# Patient Record
Sex: Male | Born: 1955 | Race: White | Hispanic: No | Marital: Married | Smoking: Current every day smoker
Health system: Southern US, Community
[De-identification: ages and names within clinical notes are randomized; demographics above are authoritative.]

## PROBLEM LIST (undated history)

## (undated) DIAGNOSIS — M7591 Shoulder lesion, unspecified, right shoulder: Secondary | ICD-10-CM

## (undated) DIAGNOSIS — I10 Essential (primary) hypertension: Secondary | ICD-10-CM

## (undated) DIAGNOSIS — R251 Tremor, unspecified: Secondary | ICD-10-CM

## (undated) DIAGNOSIS — E7800 Pure hypercholesterolemia, unspecified: Secondary | ICD-10-CM

## (undated) DIAGNOSIS — F32A Depression, unspecified: Secondary | ICD-10-CM

## (undated) DIAGNOSIS — Z789 Other specified health status: Secondary | ICD-10-CM

## (undated) DIAGNOSIS — K635 Polyp of colon: Secondary | ICD-10-CM

## (undated) DIAGNOSIS — F329 Major depressive disorder, single episode, unspecified: Secondary | ICD-10-CM

## (undated) HISTORY — PX: VASECTOMY: SHX75

## (undated) HISTORY — PX: FOOT SURGERY: SHX648

## (undated) HISTORY — PX: APPENDECTOMY: SHX54

## (undated) HISTORY — PX: HAND SURGERY: SHX662

## (undated) HISTORY — PX: HERNIA REPAIR: SHX51

## (undated) HISTORY — PX: TONSILLECTOMY: SUR1361

## (undated) HISTORY — PX: COLONOSCOPY: SHX174

---

## 2012-08-18 ENCOUNTER — Ambulatory Visit: Payer: Self-pay

## 2012-08-18 LAB — DOT URINE DIP
Blood: NEGATIVE
Glucose,UR: NEGATIVE mg/dL (ref 0–75)
Protein: NEGATIVE
Specific Gravity: 1.02 (ref 1.003–1.030)

## 2014-12-11 ENCOUNTER — Emergency Department (HOSPITAL_COMMUNITY): Payer: Worker's Compensation

## 2014-12-11 ENCOUNTER — Encounter (HOSPITAL_COMMUNITY): Payer: Self-pay

## 2014-12-11 ENCOUNTER — Emergency Department (HOSPITAL_COMMUNITY)
Admission: EM | Admit: 2014-12-11 | Discharge: 2014-12-11 | Disposition: A | Discharge status: Home / Self Care | Payer: Worker's Compensation | Attending: Emergency Medicine | Admitting: Emergency Medicine

## 2014-12-11 DIAGNOSIS — Z8639 Personal history of other endocrine, nutritional and metabolic disease: Secondary | ICD-10-CM | POA: Insufficient documentation

## 2014-12-11 DIAGNOSIS — W19XXXA Unspecified fall, initial encounter: Secondary | ICD-10-CM

## 2014-12-11 DIAGNOSIS — Y9289 Other specified places as the place of occurrence of the external cause: Secondary | ICD-10-CM | POA: Insufficient documentation

## 2014-12-11 DIAGNOSIS — S2231XA Fracture of one rib, right side, initial encounter for closed fracture: Secondary | ICD-10-CM | POA: Insufficient documentation

## 2014-12-11 DIAGNOSIS — Y990 Civilian activity done for income or pay: Secondary | ICD-10-CM | POA: Diagnosis not present

## 2014-12-11 DIAGNOSIS — Z8659 Personal history of other mental and behavioral disorders: Secondary | ICD-10-CM | POA: Diagnosis not present

## 2014-12-11 DIAGNOSIS — W108XXA Fall (on) (from) other stairs and steps, initial encounter: Secondary | ICD-10-CM | POA: Insufficient documentation

## 2014-12-11 DIAGNOSIS — Z720 Tobacco use: Secondary | ICD-10-CM | POA: Diagnosis not present

## 2014-12-11 DIAGNOSIS — S299XXA Unspecified injury of thorax, initial encounter: Secondary | ICD-10-CM | POA: Diagnosis present

## 2014-12-11 DIAGNOSIS — S3991XA Unspecified injury of abdomen, initial encounter: Secondary | ICD-10-CM | POA: Insufficient documentation

## 2014-12-11 DIAGNOSIS — Y9389 Activity, other specified: Secondary | ICD-10-CM | POA: Insufficient documentation

## 2014-12-11 HISTORY — DX: Major depressive disorder, single episode, unspecified: F32.9

## 2014-12-11 HISTORY — DX: Depression, unspecified: F32.A

## 2014-12-11 HISTORY — DX: Pure hypercholesterolemia, unspecified: E78.00

## 2014-12-11 LAB — URINALYSIS, ROUTINE W REFLEX MICROSCOPIC
GLUCOSE, UA: NEGATIVE mg/dL
Hgb urine dipstick: NEGATIVE
KETONES UR: NEGATIVE mg/dL
Leukocytes, UA: NEGATIVE
Nitrite: NEGATIVE
Protein, ur: NEGATIVE mg/dL
Specific Gravity, Urine: 1.029 (ref 1.005–1.030)
Urobilinogen, UA: 0.2 mg/dL (ref 0.0–1.0)
pH: 5 (ref 5.0–8.0)

## 2014-12-11 MED ORDER — IBUPROFEN 800 MG PO TABS: 800.0000 mg | ORAL_TABLET | Freq: Three times a day (TID) | ORAL | Status: DC | PRN

## 2014-12-11 MED ORDER — OXYCODONE-ACETAMINOPHEN 5-325 MG PO TABS: 1.0000 | ORAL_TABLET | ORAL | Status: DC | PRN

## 2014-12-11 MED ORDER — OXYCODONE-ACETAMINOPHEN 5-325 MG PO TABS
2.0000 | ORAL_TABLET | Freq: Once | ORAL | Status: AC
  Administered 2014-12-11: 2 via ORAL
  Filled 2014-12-11: qty 2

## 2014-12-11 NOTE — ED Notes (Signed)
Pt here for fall 12 hours ago at work and landed on right flank, pt reports pain to same area, seen at [facility] and sent here to rule out possible issue with diaphragm and has fractured ribs.

## 2014-12-11 NOTE — Discharge Instructions (Signed)

## 2014-12-11 NOTE — ED Notes (Signed)
Dr.ward to come speak with before d/c

## 2014-12-11 NOTE — ED Provider Notes (Signed)
TIME SEEN: 11:10 AM  CHIEF COMPLAINT: Right lateral chest pain and flank pain  HPI: Pt is a 59 y.o. with history of hyperlipidemia, depression who presents emergency department with a mechanical fall when he slipped on ice last night at 10:30 PM and fell down 5 stairs. Denies hitting his head or losing consciousness. Not on anticoagulation. Was seen at [facility] today and had an acute abdominal series and they were concerned for diaphragmatic rupture on the right side and sent him to the ED. Denies shortness of breath. Denies abdominal pain.  No nausea, vomiting or diarrhea. No bloody stool or melena. No hematuria. No numbness, tingling or focal weakness. States he just feels "sore all over". States he was able to work last night.  ROS: See HPI Constitutional: no fever  Eyes: no drainage  ENT: no runny nose   Cardiovascular:  no chest pain  Resp: no SOB  GI: no vomiting GU: no dysuria Integumentary: no rash  Allergy: no hives  Musculoskeletal: no leg swelling  Neurological: no slurred speech ROS otherwise negative  PAST MEDICAL HISTORY/PAST SURGICAL HISTORY:  Past Medical History  Diagnosis Date  . High cholesterol   . Depression     MEDICATIONS:  Prior to Admission medications   Not on File    ALLERGIES:  No Known Allergies  SOCIAL HISTORY:  History  Substance Use Topics  . Smoking status: Current Every Day Smoker  . Smokeless tobacco: Not on file  . Alcohol Use: Yes    FAMILY HISTORY: History reviewed. No pertinent family history.  EXAM: BP 149/90 mmHg  Pulse 80  Temp(Src) 98.4 F (36.9 C) (Oral)  Resp 16  Ht  (1.778 m)  Wt 180 lb (81.647 kg)  BMI 25.83 kg/m2  SpO2 97% CONSTITUTIONAL: Alert and oriented and responds appropriately to questions. Well-appearing; well-nourished; GCS 15 HEAD: Normocephalic; atraumatic EYES: Conjunctivae clear, PERRL, EOMI ENT: normal nose; no rhinorrhea; moist mucous membranes; pharynx without lesions noted; no dental  injury;  no septal hematoma NECK: Supple, no meningismus, no LAD; no midline spinal tenderness, step-off or deformity CARD: RRR; S1 and S2 appreciated; no murmurs, no clicks, no rubs, no gallops RESP: Normal chest excursion without splinting or tachypnea; breath sounds clear and equal bilaterally; no wheezes, no rhonchi, no rales; chest wall stable, mild stenosis palpation over the right flank and right lateral ribs without crepitus or ecchymosis or deformity ABD/GI: Normal bowel sounds; non-distended; soft, non-tender, no rebound, no guarding PELVIS:  stable, nontender to palpation BACK:  The back appears normal and is non-tender to palpation, there is no CVA tenderness; no midline spinal tenderness, step-off or deformity EXT: Normal ROM in all joints; non-tender to palpation; no edema; normal capillary refill; no cyanosis    SKIN: Normal color for age and race; warm NEURO: Moves all extremities equally, sensation to light touch intact diffusely, cranial nerves II through XII intact PSYCH: The patient's mood and manner are appropriate. Grooming and personal hygiene are appropriate.  MEDICAL DECISION MAKING: Patient here with mechanical fall with right flank pain. Concern for possible diaphragmatic rupture at [facility] on right side - very unlikely given this is right-sided and has no shortness of breath or right upper quadrant abdominal pain on exam. I have reviewed the x-ray pronounced that were sent with the patient and I do not see an obvious diaphragmatic rupture. Will repeat upright abdominal series, left rib series. We'll give Percocet. Will check urinalysis for gross hematuria. No other injury on exam. Neurologically intact,  hemodynamically stable.  ED PROGRESS: Patient appears to have a 10th and 11th right rib fracture with no pneumothorax. Specifically there is no diaphragmatic rupture. Patient is still hemodynamically stable. We'll discharge home with perception for pain medicine and an  incentive spirometer. Discussed return precautions. Patient verbalizes understanding and is comfortable with plan.     Layla MawKristen N Ward, DO 12/11/14 1258

## 2017-01-07 ENCOUNTER — Other Ambulatory Visit: Payer: Self-pay | Admitting: Otolaryngology

## 2017-01-07 DIAGNOSIS — R070 Pain in throat: Secondary | ICD-10-CM

## 2017-01-18 ENCOUNTER — Ambulatory Visit: Payer: Managed Care, Other (non HMO)

## 2017-01-22 ENCOUNTER — Ambulatory Visit
Admission: RE | Admit: 2017-01-22 | Discharge: 2017-01-22 | Disposition: A | Discharge status: Home / Self Care | Payer: Managed Care, Other (non HMO) | Source: Ambulatory Visit | Attending: Otolaryngology | Admitting: Otolaryngology

## 2017-01-22 DIAGNOSIS — R070 Pain in throat: Secondary | ICD-10-CM | POA: Insufficient documentation

## 2017-01-22 DIAGNOSIS — M4306 Spondylolysis, lumbar region: Secondary | ICD-10-CM | POA: Insufficient documentation

## 2017-01-22 DIAGNOSIS — J320 Chronic maxillary sinusitis: Secondary | ICD-10-CM | POA: Insufficient documentation

## 2017-01-22 MED ORDER — IOPAMIDOL (ISOVUE-300) INJECTION 61%
75.0000 mL | Freq: Once | INTRAVENOUS | Status: AC | PRN
  Administered 2017-01-22: 75 mL via INTRAVENOUS

## 2017-09-29 IMAGING — CT CT NECK W/ CM
2 of 3 series · 8 of 14 positions shown, 9 images · IV contrast (iopamidol)
Comparison: None.

CLINICAL DATA: Initial evaluation for mid throat discomfort with
swallowing since [DATE]. No history of cancer or surgery. Current
smoker.

EXAM:
CT NECK WITH CONTRAST
TECHNIQUE: Multidetector CT imaging of the neck was performed using the
standard protocol following the bolus administration of intravenous
contrast.
CONTRAST:  75mL [id number]-877 IOPAMIDOL (WUUWJG-877) INJECTION 61%

[Series 2: axial neck · axial · 0.60mm/px · z∈[-245,-53]mm · 4 of 160 slices shown]
[im 32/160  bone]
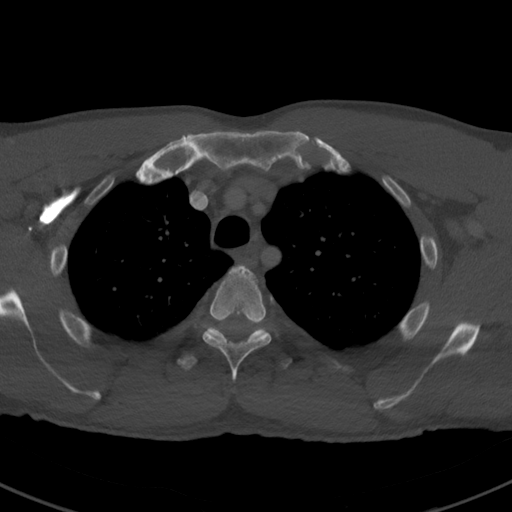
[im 64/160  bone]
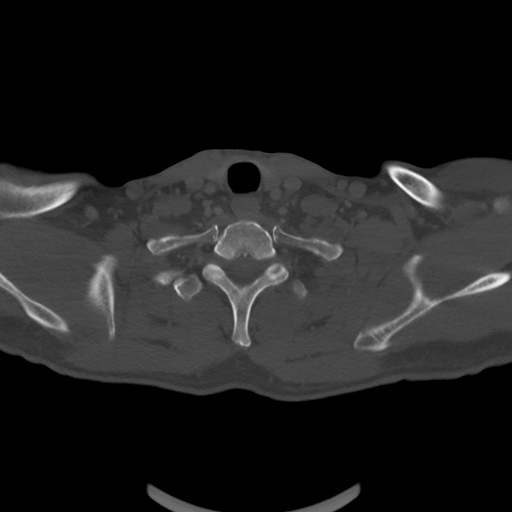
[im 96/160  bone]
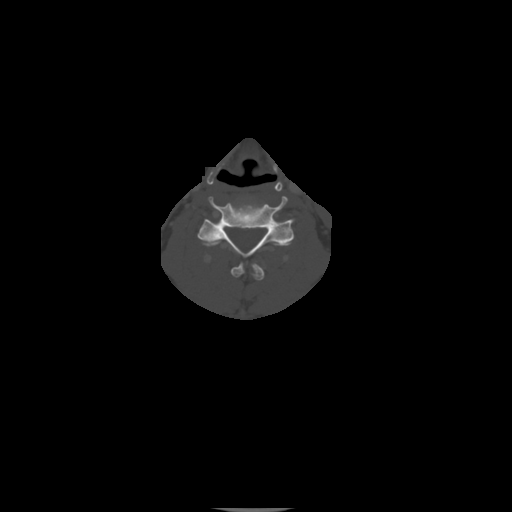
[im 128/160  bone]
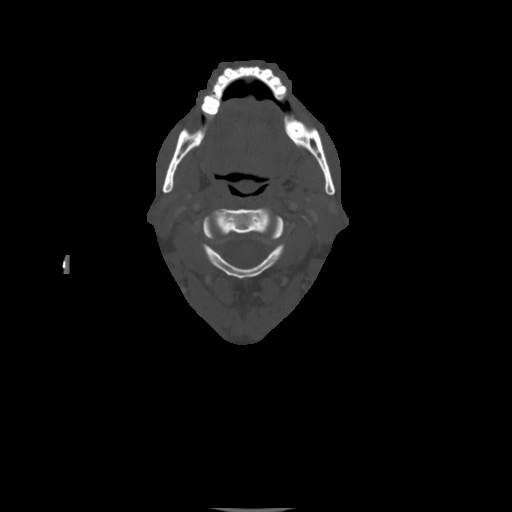

[Series 7: orthogonal ax · axial · 0.48mm/px · z∈[-264,-58]mm · 4 of 174 slices shown, 5 images]
[im 35/174  soft-tissue]
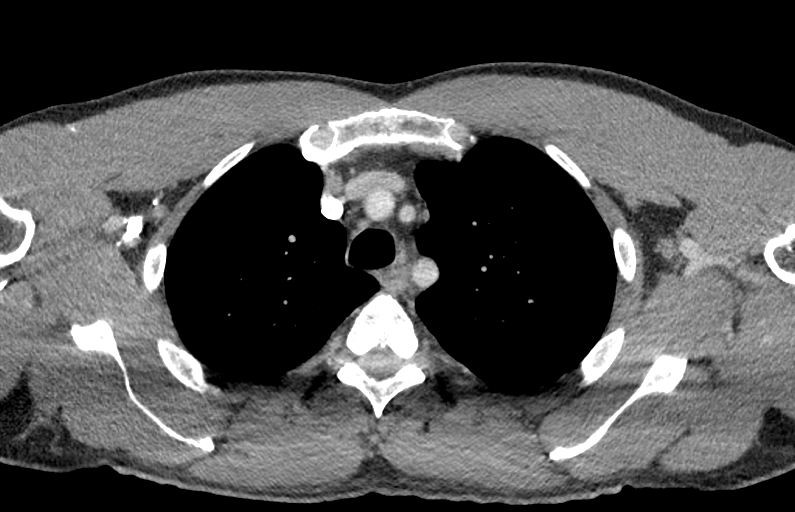
[im 35/174  bone]
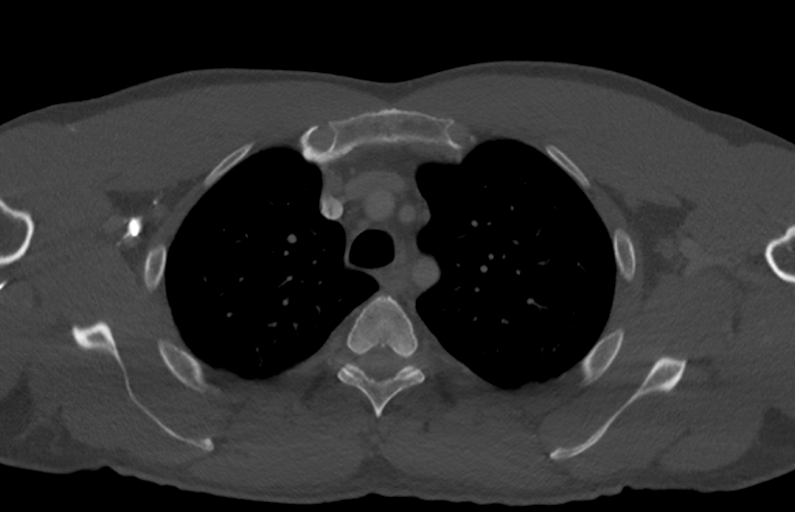
[im 70/174  bone]
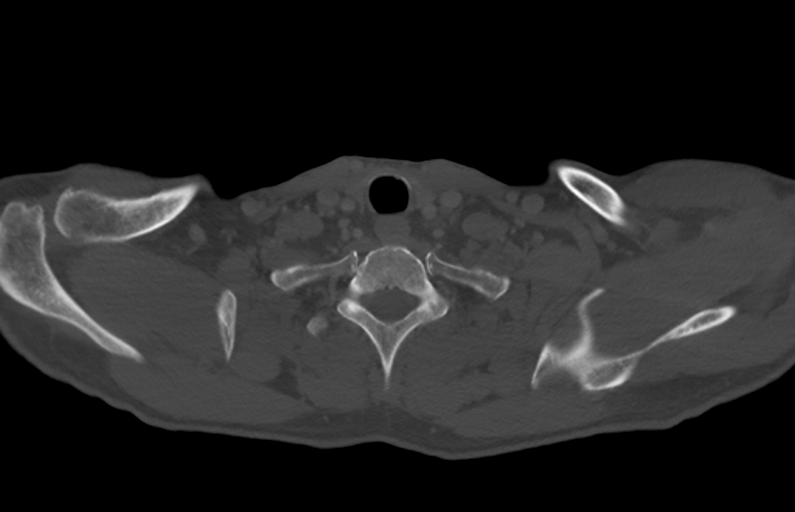
[im 104/174  bone]
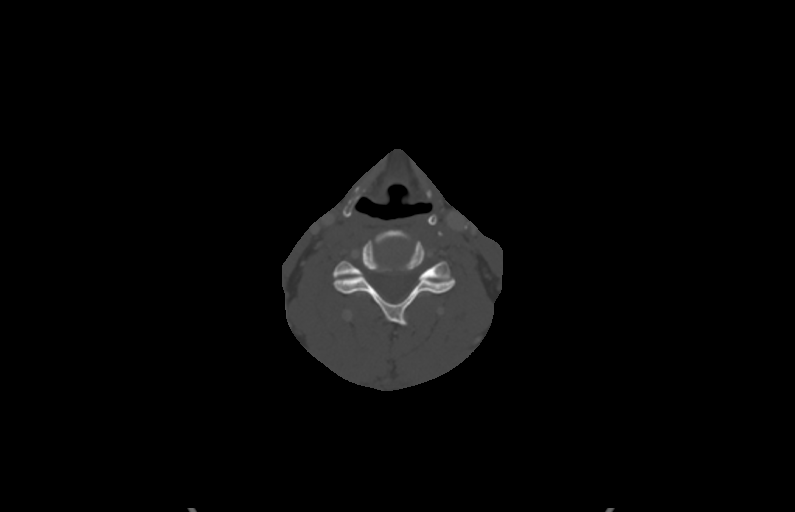
[im 139/174  bone]
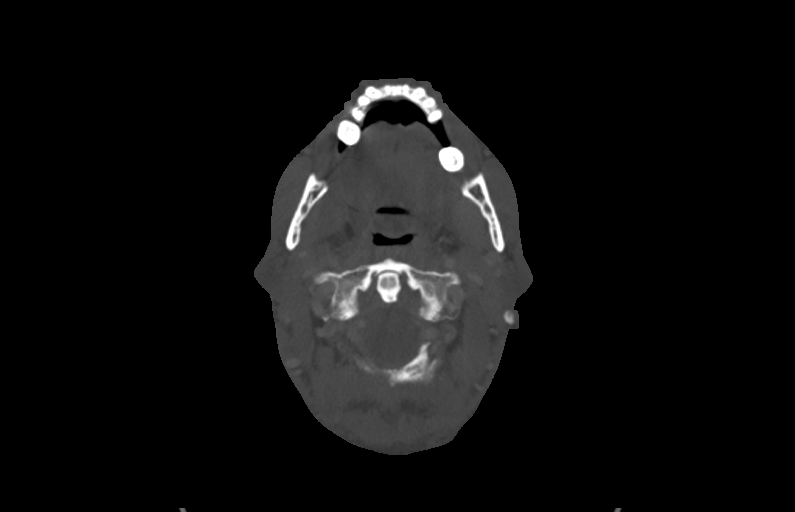

[8 of 14 positions shown; findings below may reference images not displayed]

FINDINGS: Pharynx and larynx: Oral cavity within normal limits without mass
lesion or loculated fluid collection. No acute abnormality about the
dentition. Periapical lucency noted about the right mandibular
second bicuspid. Palatine tonsils symmetric and within normal
limits. Parapharyngeal 5 preserved. Nasopharynx within normal
limits. Remainder of the oropharynx is within normal limits. The
epiglottis normal. Vallecula clear. Retropharyngeal soft tissues
within normal limits. Remainder of the hypopharynx and supraglottic
larynx within normal limits. True cords are largely apposed and not
well evaluated, but grossly within normal limits. Subglottic airway
is clear.

Salivary glands: Salivary glands including the parotid and
submandibular glands are normal.

Thyroid: Thyroid within normal limits.

Lymph nodes: No pathologically enlarged lymph nodes identified
within the neck.

Vascular: Normal intravascular enhancement seen throughout the neck.
Scattered calcified plaque noted about the carotid bifurcations
bilaterally.

Limited intracranial: Unremarkable.

Visualized orbits: Visualized portions of the inferior orbits are
unremarkable.

Mastoids and visualized paranasal sinuses: Moderate polypoid mucosal
thickening noted within the maxillary sinuses bilaterally.
Visualized paranasal sinuses are otherwise clear. No mastoid
effusion. Middle ear cavities are clear.

Skeleton: No acute osseus abnormality. No worrisome lytic or blastic
osseous lesions. Mild multilevel degenerative spondylolysis noted
within the visualized spine, most notable at C6-7. Additional
degenerative disc bulging noted at C3-4, C4-5 common C5-6.

Upper chest: Visualized upper mediastinum within normal limits.
Visualized lungs are clear.

Other: None.
IMPRESSION: 1. Normal CT of the neck. No findings to explain patient's symptoms
identified. No mass lesion or adenopathy.
2. Moderate allergic/inflammatory maxillary sinus disease.
3. Atherosclerotic changes about the carotid bifurcations
bilaterally.
4. Mild multilevel degenerative spondylolysis within the cervical
spine, most notable at C6-7.

## 2023-11-19 ENCOUNTER — Other Ambulatory Visit: Payer: Self-pay | Admitting: Orthopedic Surgery

## 2023-11-19 DIAGNOSIS — S46001A Unspecified injury of muscle(s) and tendon(s) of the rotator cuff of right shoulder, initial encounter: Secondary | ICD-10-CM

## 2023-11-19 DIAGNOSIS — M25311 Other instability, right shoulder: Secondary | ICD-10-CM

## 2023-11-19 DIAGNOSIS — M25511 Pain in right shoulder: Secondary | ICD-10-CM

## 2023-12-01 ENCOUNTER — Encounter: Payer: Self-pay | Admitting: Orthopedic Surgery

## 2023-12-04 ENCOUNTER — Ambulatory Visit
Admission: RE | Admit: 2023-12-04 | Discharge: 2023-12-04 | Disposition: A | Discharge status: Home / Self Care | Payer: Managed Care, Other (non HMO) | Source: Ambulatory Visit | Attending: Orthopedic Surgery | Admitting: Orthopedic Surgery

## 2023-12-04 DIAGNOSIS — S46001A Unspecified injury of muscle(s) and tendon(s) of the rotator cuff of right shoulder, initial encounter: Secondary | ICD-10-CM

## 2023-12-04 DIAGNOSIS — M25511 Pain in right shoulder: Secondary | ICD-10-CM

## 2023-12-04 DIAGNOSIS — M25311 Other instability, right shoulder: Secondary | ICD-10-CM

## 2023-12-30 ENCOUNTER — Other Ambulatory Visit: Payer: Self-pay | Admitting: Orthopedic Surgery

## 2024-01-07 ENCOUNTER — Encounter
Admission: RE | Admit: 2024-01-07 | Discharge: 2024-01-07 | Disposition: A | Discharge status: Home / Self Care | Source: Ambulatory Visit | Attending: Orthopedic Surgery | Admitting: Orthopedic Surgery

## 2024-01-07 VITALS — Ht 70.0 in | Wt 200.0 lb

## 2024-01-07 DIAGNOSIS — Z01812 Encounter for preprocedural laboratory examination: Secondary | ICD-10-CM

## 2024-01-07 DIAGNOSIS — Z01810 Encounter for preprocedural cardiovascular examination: Secondary | ICD-10-CM

## 2024-01-07 HISTORY — DX: Essential (primary) hypertension: I10

## 2024-01-07 HISTORY — DX: Shoulder lesion, unspecified, right shoulder: M75.91

## 2024-01-07 HISTORY — DX: Tremor, unspecified: R25.1

## 2024-01-07 HISTORY — DX: Other specified health status: Z78.9

## 2024-01-07 HISTORY — DX: Polyp of colon: K63.5

## 2024-01-07 NOTE — Patient Instructions (Signed)
 Your procedure is scheduled on:01-14-24 Friday Report to the Registration Desk on the 1st floor of the [facility].Then proceed to the 2nd floor Surgery Desk To find out your arrival time, please call [phone] between 1PM - 3PM on:01-13-24 Thursday If your arrival time is 6:00 am, do not arrive before that time as the [facility] entrance doors do not open until 6:00 am.  REMEMBER: Instructions that are not followed completely may result in serious medical risk, up to and including death; or upon the discretion of your surgeon and anesthesiologist your surgery may need to be rescheduled.  Do not eat food after midnight the night before surgery.  No gum chewing or hard candies.  You may however, drink CLEAR liquids up to 2 hours before you are scheduled to arrive for your surgery. Do not drink anything within 2 hours of your scheduled arrival time.  Clear liquids include: - water  - apple juice without pulp - gatorade (not RED colors) - black coffee or tea (Do NOT add milk or creamers to the coffee or tea) Do NOT drink anything that is not on this list.  In addition, your doctor has ordered for you to drink the provided:  Ensure Pre-Surgery Clear Carbohydrate Drink  Drinking this carbohydrate drink up to two hours before surgery helps to reduce insulin resistance and improve patient outcomes. Please complete drinking 2 hours before scheduled arrival time.  One week prior to surgery:Stop NOW (01-07-24) Stop Anti-inflammatories (NSAIDS) such as Advil, Aleve, Ibuprofen, Motrin, Naproxen, Naprosyn and Aspirin based products such as Excedrin, Goody's Powder, BC Powder. Stop ANY OVER THE COUNTER supplements until after surgery.  You may however, continue to take Tylenol if needed for pain up until the day of surgery.  Continue taking all of your other prescription medications up until the day of surgery.  ON THE DAY OF SURGERY ONLY TAKE THESE MEDICATIONS WITH SIPS OF  WATER: -atorvastatin (LIPITOR)  -primidone (MYSOLINE)  -venlafaxine XR (EFFEXOR-XR)   No Alcohol for 24 hours before or after surgery.  No Smoking including e-cigarettes for 24 hours before surgery.  No chewable tobacco products for at least 6 hours before surgery.  No nicotine patches on the day of surgery.  Do not use any "recreational" drugs for at least a week (preferably 2 weeks) before your surgery.  Please be advised that the combination of cocaine and anesthesia may have negative outcomes, up to and including death. If you test positive for cocaine, your surgery will be cancelled.  On the morning of surgery brush your teeth with toothpaste and water, you may rinse your mouth with mouthwash if you wish. Do not swallow any toothpaste or mouthwash.  Use CHG Soap as directed on instruction sheet.  Do not wear jewelry, make-up, hairpins, clips or nail polish.  For welded (permanent) jewelry: bracelets, anklets, waist bands, etc.  Please have this removed prior to surgery.  If it is not removed, there is a chance that hospital personnel will need to cut it off on the day of surgery.  Do not wear lotions, powders, or perfumes.   Do not shave body hair from the neck down 48 hours before surgery.  Contact lenses, hearing aids and dentures may not be worn into surgery.  Do not bring valuables to the hospital. [facility] is not responsible for any missing/lost belongings or valuables.   Notify your doctor if there is any change in your medical condition (cold, fever, infection).  Wear comfortable clothing (specific to  your surgery type) to the hospital.  After surgery, you can help prevent lung complications by doing breathing exercises.  Take deep breaths and cough every 1-2 hours. Your doctor may order a device called an Incentive Spirometer to help you take deep breaths. When coughing or sneezing, hold a pillow firmly against your incision with both hands. This is called  "splinting." Doing this helps protect your incision. It also decreases belly discomfort.  If you are being admitted to the hospital overnight, leave your suitcase in the car. After surgery it may be brought to your room.  In case of increased patient census, it may be necessary for you, the patient, to continue your postoperative care in the Same Day Surgery department.  If you are being discharged the day of surgery, you will not be allowed to drive home. You will need a responsible individual to drive you home and stay with you for 24 hours after surgery.   If you are taking public transportation, you will need to have a responsible individual with you.  Please call the Pre-admissions Testing Dept. at [phone] if you have any questions about these instructions.  Surgery Visitation Policy:  Patients having surgery or a procedure may have two visitors.  Children under the age of 28 must have an adult with them who is not the patient.  Temporary Visitor Restrictions Due to increasing cases of flu, RSV and COVID-19: Children ages 64 and under will not be able to visit patients in [facility] hospitals under most circumstances.     Preparing for Surgery with CHLORHEXIDINE GLUCONATE (CHG) Soap  Chlorhexidine Gluconate (CHG) Soap  o An antiseptic cleaner that kills germs and bonds with the skin to continue killing germs even after washing  o Used for showering the night before surgery and morning of surgery  Before surgery, you can play an important role by reducing the number of germs on your skin.  CHG (Chlorhexidine gluconate) soap is an antiseptic cleanser which kills germs and bonds with the skin to continue killing germs even after washing.  Please do not use if you have an allergy to CHG or antibacterial soaps. If your skin becomes reddened/irritated stop using the CHG.  1. Shower the NIGHT BEFORE SURGERY and the MORNING OF SURGERY with CHG soap.  2. If you choose to  wash your hair, wash your hair first as usual with your normal shampoo.  3. After shampooing, rinse your hair and body thoroughly to remove the shampoo.  4. Use CHG as you would any other liquid soap. You can apply CHG directly to the skin and wash gently with a scrungie or a clean washcloth.  5. Apply the CHG soap to your body only from the neck down. Do not use on open wounds or open sores. Avoid contact with your eyes, ears, mouth, and genitals (private parts). Wash face and genitals (private parts) with your normal soap.  6. Wash thoroughly, paying special attention to the area where your surgery will be performed.  7. Thoroughly rinse your body with warm water.  8. Do not shower/wash with your normal soap after using and rinsing off the CHG soap.  9. Pat yourself dry with a clean towel.  10. Wear clean pajamas to bed the night before surgery.  12. Place clean sheets on your bed the night of your first shower and do not sleep with pets.  13. Shower again with the CHG soap on the day of surgery prior to arriving at  the hospital.  14. Do not apply any deodorants/lotions/powders.  15. Please wear clean clothes to the hospital.  How to Use an Incentive Spirometer An incentive spirometer is a tool that measures how well you are filling your lungs with each breath. Learning to take long, deep breaths using this tool can help you keep your lungs clear and active. This may help to reverse or lessen your chance of developing breathing (pulmonary) problems, especially infection. You may be asked to use a spirometer: After a surgery. If you have a lung problem or a history of smoking. After a long period of time when you have been unable to move or be active. If the spirometer includes an indicator to show the highest number that you have reached, your health care provider or respiratory therapist will help you set a goal. Keep a log of your progress as told by your health care  provider. What are the risks? Breathing too quickly may cause dizziness or cause you to pass out. Take your time so you do not get dizzy or light-headed. If you are in pain, you may need to take pain medicine before doing incentive spirometry. It is harder to take a deep breath if you are having pain. How to use your incentive spirometer  Sit up on the edge of your bed or on a chair. Hold the incentive spirometer so that it is in an upright position. Before you use the spirometer, breathe out normally. Place the mouthpiece in your mouth. Make sure your lips are closed tightly around it. Breathe in slowly and as deeply as you can through your mouth, causing the piston or the ball to rise toward the top of the chamber. Hold your breath for 3-5 seconds, or for as long as possible. If the spirometer includes a coach indicator, use this to guide you in breathing. Slow down your breathing if the indicator goes above the marked areas. Remove the mouthpiece from your mouth and breathe out normally. The piston or ball will return to the bottom of the chamber. Rest for a few seconds, then repeat the steps 10 or more times. Take your time and take a few normal breaths between deep breaths so that you do not get dizzy or light-headed. Do this every 1-2 hours when you are awake. If the spirometer includes a goal marker to show the highest number you have reached (best effort), use this as a goal to work toward during each repetition. After each set of 10 deep breaths, cough a few times. This will help to make sure that your lungs are clear. If you have an incision on your chest or abdomen from surgery, place a pillow or a rolled-up towel firmly against the incision when you cough. This can help to reduce pain while taking deep breaths and coughing. General tips When you are able to get out of bed: Walk around often. Continue to take deep breaths and cough in order to clear your lungs. Keep using the  incentive spirometer until your health care provider says it is okay to stop using it. If you have been in the hospital, you may be told to keep using the spirometer at home. Contact a health care provider if: You are having difficulty using the spirometer. You have trouble using the spirometer as often as instructed. Your pain medicine is not giving enough relief for you to use the spirometer as told. You have a fever. Get help right away if: You develop shortness of  breath. You develop a cough with bloody mucus from the lungs. You have fluid or blood coming from an incision site after you cough. Summary An incentive spirometer is a tool that can help you learn to take long, deep breaths to keep your lungs clear and active. You may be asked to use a spirometer after a surgery, if you have a lung problem or a history of smoking, or if you have been inactive for a long period of time. Use your incentive spirometer as instructed every 1-2 hours while you are awake. If you have an incision on your chest or abdomen, place a pillow or a rolled-up towel firmly against your incision when you cough. This will help to reduce pain. Get help right away if you have shortness of breath, you cough up bloody mucus, or blood comes from your incision when you cough. This information is not intended to replace advice given to you by your health care provider. Make sure you discuss any questions you have with your health care provider.

## 2024-01-07 NOTE — Progress Notes (Signed)
 Anesthesia interview completed. Pt needs to come in for labs and EKG. Pt states he works in [place] and doesn't get off work until 4 pm on Monday and Tuesday but states he is off on Wednesday. Put pt down for labs and EKG at 8 am on 01-12-24

## 2024-01-12 ENCOUNTER — Encounter
Admission: RE | Admit: 2024-01-12 | Discharge: 2024-01-12 | Disposition: A | Discharge status: Home / Self Care | Source: Ambulatory Visit | Attending: Orthopedic Surgery | Admitting: Orthopedic Surgery

## 2024-01-12 DIAGNOSIS — Z01818 Encounter for other preprocedural examination: Secondary | ICD-10-CM | POA: Insufficient documentation

## 2024-01-12 DIAGNOSIS — Z01810 Encounter for preprocedural cardiovascular examination: Secondary | ICD-10-CM | POA: Diagnosis not present

## 2024-01-12 DIAGNOSIS — Z01812 Encounter for preprocedural laboratory examination: Secondary | ICD-10-CM

## 2024-01-12 LAB — CBC
HCT: 44 % (ref 39.0–52.0)
Hemoglobin: 15.4 g/dL (ref 13.0–17.0)
MCH: 32 pg (ref 26.0–34.0)
MCHC: 35 g/dL (ref 30.0–36.0)
MCV: 91.5 fL (ref 80.0–100.0)
Platelets: 249 10*3/uL (ref 150–400)
RBC: 4.81 MIL/uL (ref 4.22–5.81)
RDW: 11.8 % (ref 11.5–15.5)
WBC: 4.6 10*3/uL (ref 4.0–10.5)
nRBC: 0 % (ref 0.0–0.2)

## 2024-01-14 ENCOUNTER — Ambulatory Visit

## 2024-01-14 ENCOUNTER — Other Ambulatory Visit: Payer: Self-pay

## 2024-01-14 ENCOUNTER — Ambulatory Visit: Admitting: Anesthesiology

## 2024-01-14 ENCOUNTER — Encounter: Payer: Self-pay | Admitting: Orthopedic Surgery

## 2024-01-14 ENCOUNTER — Encounter
Admission: RE | Disposition: A | Discharge status: Home / Self Care | Payer: Self-pay | Source: Home / Self Care | Attending: Orthopedic Surgery

## 2024-01-14 ENCOUNTER — Ambulatory Visit
Admission: RE | Admit: 2024-01-14 | Discharge: 2024-01-14 | Disposition: A | Discharge status: Home / Self Care | Attending: Orthopedic Surgery | Admitting: Orthopedic Surgery

## 2024-01-14 DIAGNOSIS — F32A Depression, unspecified: Secondary | ICD-10-CM | POA: Diagnosis not present

## 2024-01-14 DIAGNOSIS — Z79899 Other long term (current) drug therapy: Secondary | ICD-10-CM | POA: Insufficient documentation

## 2024-01-14 DIAGNOSIS — Z87891 Personal history of nicotine dependence: Secondary | ICD-10-CM | POA: Insufficient documentation

## 2024-01-14 DIAGNOSIS — M19011 Primary osteoarthritis, right shoulder: Secondary | ICD-10-CM | POA: Insufficient documentation

## 2024-01-14 DIAGNOSIS — S46011A Strain of muscle(s) and tendon(s) of the rotator cuff of right shoulder, initial encounter: Secondary | ICD-10-CM | POA: Diagnosis not present

## 2024-01-14 DIAGNOSIS — M7521 Bicipital tendinitis, right shoulder: Secondary | ICD-10-CM | POA: Diagnosis not present

## 2024-01-14 DIAGNOSIS — Z7951 Long term (current) use of inhaled steroids: Secondary | ICD-10-CM | POA: Insufficient documentation

## 2024-01-14 DIAGNOSIS — Y9389 Activity, other specified: Secondary | ICD-10-CM | POA: Diagnosis not present

## 2024-01-14 DIAGNOSIS — M7541 Impingement syndrome of right shoulder: Secondary | ICD-10-CM | POA: Diagnosis not present

## 2024-01-14 DIAGNOSIS — X500XXA Overexertion from strenuous movement or load, initial encounter: Secondary | ICD-10-CM | POA: Insufficient documentation

## 2024-01-14 DIAGNOSIS — I10 Essential (primary) hypertension: Secondary | ICD-10-CM | POA: Insufficient documentation

## 2024-01-14 DIAGNOSIS — M25311 Other instability, right shoulder: Secondary | ICD-10-CM | POA: Diagnosis present

## 2024-01-14 HISTORY — PX: SHOULDER ARTHROSCOPY WITH OPEN ROTATOR CUFF REPAIR: SHX6092

## 2024-01-14 SURGERY — ARTHROSCOPY, SHOULDER WITH REPAIR, ROTATOR CUFF, OPEN
Anesthesia: General | Site: Shoulder | Laterality: Right

## 2024-01-14 MED ORDER — LIDOCAINE HCL (PF) 2 % IJ SOLN
INTRAMUSCULAR | Status: AC
  Filled 2024-01-14: qty 5

## 2024-01-14 MED ORDER — OXYCODONE HCL 5 MG PO TABS: 5.0000 mg | ORAL_TABLET | ORAL | 0 refills | Status: AC | PRN

## 2024-01-14 MED ORDER — EPHEDRINE SULFATE-NACL 50-0.9 MG/10ML-% IV SOSY
PREFILLED_SYRINGE | INTRAVENOUS | Status: DC | PRN
  Administered 2024-01-14: 10 mg via INTRAVENOUS
  Administered 2024-01-14: 5 mg via INTRAVENOUS
  Administered 2024-01-14: 10 mg via INTRAVENOUS

## 2024-01-14 MED ORDER — ONDANSETRON HCL 4 MG/2ML IJ SOLN
INTRAMUSCULAR | Status: DC | PRN
  Administered 2024-01-14: 4 mg via INTRAVENOUS

## 2024-01-14 MED ORDER — CHLORHEXIDINE GLUCONATE 0.12 % MT SOLN: 15.0000 mL | Freq: Once | OROMUCOSAL | Status: DC

## 2024-01-14 MED ORDER — DEXAMETHASONE SODIUM PHOSPHATE 10 MG/ML IJ SOLN
INTRAMUSCULAR | Status: AC
  Filled 2024-01-14: qty 1

## 2024-01-14 MED ORDER — BUPIVACAINE LIPOSOME 1.3 % IJ SUSP
INTRAMUSCULAR | Status: DC | PRN
  Administered 2024-01-14: 20 mL

## 2024-01-14 MED ORDER — ROCURONIUM BROMIDE 100 MG/10ML IV SOLN
INTRAVENOUS | Status: DC | PRN
  Administered 2024-01-14: 50 mg via INTRAVENOUS

## 2024-01-14 MED ORDER — MIDAZOLAM HCL 2 MG/2ML IJ SOLN
1.0000 mg | INTRAMUSCULAR | Status: DC | PRN
  Administered 2024-01-14: 1 mg via INTRAVENOUS

## 2024-01-14 MED ORDER — MIDAZOLAM HCL 2 MG/2ML IJ SOLN
INTRAMUSCULAR | Status: AC
  Filled 2024-01-14: qty 2

## 2024-01-14 MED ORDER — DEXAMETHASONE SODIUM PHOSPHATE 10 MG/ML IJ SOLN
INTRAMUSCULAR | Status: DC | PRN
  Administered 2024-01-14: 10 mg via INTRAVENOUS

## 2024-01-14 MED ORDER — CEFAZOLIN SODIUM-DEXTROSE 2-4 GM/100ML-% IV SOLN
2.0000 g | INTRAVENOUS | Status: AC
Start: 2024-01-14 — End: 2024-01-14
  Administered 2024-01-14 (×2): 2 g via INTRAVENOUS

## 2024-01-14 MED ORDER — LACTATED RINGERS IV SOLN: INTRAVENOUS | Status: DC

## 2024-01-14 MED ORDER — BUPIVACAINE HCL (PF) 0.5 % IJ SOLN
INTRAMUSCULAR | Status: AC
  Filled 2024-01-14: qty 10

## 2024-01-14 MED ORDER — ASPIRIN 325 MG PO TBEC
325.0000 mg | DELAYED_RELEASE_TABLET | Freq: Every day | ORAL | 0 refills | Status: AC
Start: 2024-01-14 — End: 2024-01-28

## 2024-01-14 MED ORDER — BUPIVACAINE HCL (PF) 0.5 % IJ SOLN
INTRAMUSCULAR | Status: DC | PRN
  Administered 2024-01-14: 10 mL

## 2024-01-14 MED ORDER — ACETAMINOPHEN 500 MG PO TABS: 1000.0000 mg | ORAL_TABLET | Freq: Three times a day (TID) | ORAL | 2 refills | Status: AC

## 2024-01-14 MED ORDER — ONDANSETRON HCL 4 MG/2ML IJ SOLN
INTRAMUSCULAR | Status: AC
  Filled 2024-01-14: qty 2

## 2024-01-14 MED ORDER — ONDANSETRON 4 MG PO TBDP
4.0000 mg | ORAL_TABLET | Freq: Three times a day (TID) | ORAL | 0 refills | Status: AC | PRN
Start: 2024-01-14 — End: ?

## 2024-01-14 MED ORDER — FENTANYL CITRATE PF 50 MCG/ML IJ SOSY
50.0000 ug | PREFILLED_SYRINGE | Freq: Once | INTRAMUSCULAR | Status: AC
  Administered 2024-01-14: 50 ug via INTRAVENOUS

## 2024-01-14 MED ORDER — BUPIVACAINE LIPOSOME 1.3 % IJ SUSP
INTRAMUSCULAR | Status: AC
  Filled 2024-01-14: qty 20

## 2024-01-14 MED ORDER — RINGERS IRRIGATION IR SOLN
Status: DC | PRN
  Administered 2024-01-14: 6000 mL
  Administered 2024-01-14 (×2): 3000 mL

## 2024-01-14 MED ORDER — PROPOFOL 10 MG/ML IV BOLUS
INTRAVENOUS | Status: AC
  Filled 2024-01-14: qty 20

## 2024-01-14 MED ORDER — GLYCOPYRROLATE 0.2 MG/ML IJ SOLN
INTRAMUSCULAR | Status: AC
  Filled 2024-01-14: qty 1

## 2024-01-14 MED ORDER — CEFAZOLIN SODIUM-DEXTROSE 2-4 GM/100ML-% IV SOLN
INTRAVENOUS | Status: AC
  Filled 2024-01-14: qty 100

## 2024-01-14 MED ORDER — PROPOFOL 10 MG/ML IV BOLUS
INTRAVENOUS | Status: DC | PRN
  Administered 2024-01-14: 200 mg via INTRAVENOUS

## 2024-01-14 MED ORDER — CHLORHEXIDINE GLUCONATE 0.12 % MT SOLN
OROMUCOSAL | Status: AC
  Filled 2024-01-14: qty 15

## 2024-01-14 MED ORDER — PHENYLEPHRINE HCL-NACL 20-0.9 MG/250ML-% IV SOLN
INTRAVENOUS | Status: DC | PRN
  Administered 2024-01-14: 25 ug/min via INTRAVENOUS

## 2024-01-14 MED ORDER — LACTATED RINGERS IV SOLN
INTRAVENOUS | Status: DC | PRN
  Administered 2024-01-14 (×4): 3000 mL

## 2024-01-14 MED ORDER — PHENYLEPHRINE 80 MCG/ML (10ML) SYRINGE FOR IV PUSH (FOR BLOOD PRESSURE SUPPORT)
PREFILLED_SYRINGE | INTRAVENOUS | Status: DC | PRN
  Administered 2024-01-14 (×2): 80 ug via INTRAVENOUS

## 2024-01-14 MED ORDER — LIDOCAINE HCL (CARDIAC) PF 100 MG/5ML IV SOSY
PREFILLED_SYRINGE | INTRAVENOUS | Status: DC | PRN
  Administered 2024-01-14: 100 mg via INTRAVENOUS

## 2024-01-14 MED ORDER — BUPIVACAINE-EPINEPHRINE (PF) 0.5% -1:200000 IJ SOLN
INTRAMUSCULAR | Status: AC
  Filled 2024-01-14: qty 30

## 2024-01-14 MED ORDER — GLYCOPYRROLATE 0.2 MG/ML IJ SOLN
INTRAMUSCULAR | Status: DC | PRN
  Administered 2024-01-14: .2 mg via INTRAVENOUS

## 2024-01-14 MED ORDER — SUGAMMADEX SODIUM 200 MG/2ML IV SOLN
INTRAVENOUS | Status: AC
  Filled 2024-01-14: qty 2

## 2024-01-14 MED ORDER — ORAL CARE MOUTH RINSE: 15.0000 mL | Freq: Once | OROMUCOSAL | Status: DC

## 2024-01-14 MED ORDER — EPINEPHRINE PF 1 MG/ML IJ SOLN
INTRAMUSCULAR | Status: AC
  Filled 2024-01-14: qty 4

## 2024-01-14 MED ORDER — ROCURONIUM BROMIDE 10 MG/ML (PF) SYRINGE
PREFILLED_SYRINGE | INTRAVENOUS | Status: AC
  Filled 2024-01-14: qty 10

## 2024-01-14 MED ORDER — FENTANYL CITRATE PF 50 MCG/ML IJ SOSY
PREFILLED_SYRINGE | INTRAMUSCULAR | Status: AC
  Filled 2024-01-14: qty 1

## 2024-01-14 MED ORDER — CEFAZOLIN SODIUM 1 G IJ SOLR
INTRAMUSCULAR | Status: AC
  Filled 2024-01-14: qty 10

## 2024-01-14 SURGICAL SUPPLY — 74 items
ADAPTER IRRIG TUBE 2 SPIKE SOL (ADAPTER) ×2 IMPLANT
ANCHOR 2.3 SP SGL 1.2 XBRAID (Anchor) IMPLANT
ANCHOR SWIVELOCK BIO 4.75X19.1 (Anchor) IMPLANT
BLADE SHAVER 4.5X7 STR FR (MISCELLANEOUS) ×1 IMPLANT
BLADE SW THK.38XMED LNG THN (BLADE) IMPLANT
BNDG ADH 2 X3.75 FABRIC TAN LF (GAUZE/BANDAGES/DRESSINGS) ×1 IMPLANT
BUR BR 5.5 WIDE MOUTH (BURR) IMPLANT
CANNULA PART THRD DISP 5.75X7 (CANNULA) IMPLANT
CANNULA PARTIAL THREAD 2X7 (CANNULA) ×1 IMPLANT
CANNULA TWIST IN 8.25X7CM (CANNULA) IMPLANT
CANNULA TWIST IN 8.25X9CM (CANNULA) IMPLANT
CHLORAPREP W/TINT 26 (MISCELLANEOUS) ×1 IMPLANT
COOLER POLAR GLACIER W/PUMP (MISCELLANEOUS) ×1 IMPLANT
COVER LIGHT HANDLE STERIS (MISCELLANEOUS) ×1 IMPLANT
DERMABOND ADVANCED .7 DNX12 (GAUZE/BANDAGES/DRESSINGS) IMPLANT
DRAPE INCISE IOBAN 66X45 STRL (DRAPES) ×1 IMPLANT
DRAPE U-SHAPE 47X51 STRL (DRAPES) ×2 IMPLANT
DRSG DERMACEA NONADH 3X8 (GAUZE/BANDAGES/DRESSINGS) IMPLANT
DRSG TEGADERM 4X4.75 (GAUZE/BANDAGES/DRESSINGS) IMPLANT
DRSG XEROFORM 1X8 (GAUZE/BANDAGES/DRESSINGS) IMPLANT
ELECT REM PT RETURN 9FT ADLT (ELECTROSURGICAL) ×1 IMPLANT
ELECTRODE REM PT RTRN 9FT ADLT (ELECTROSURGICAL) ×1 IMPLANT
GAUZE SPONGE 4X4 12PLY STRL (GAUZE/BANDAGES/DRESSINGS) ×1 IMPLANT
GAUZE XEROFORM 1X8 LF (GAUZE/BANDAGES/DRESSINGS) ×1 IMPLANT
GLOVE BIO SURGEON STRL SZ7.5 (GLOVE) ×2 IMPLANT
GLOVE BIOGEL PI IND STRL 8 (GLOVE) ×2 IMPLANT
GLOVE SURG SYN 8.0 (GLOVE) ×2 IMPLANT
GLOVE SURG SYN 8.0 PF PI (GLOVE) ×2 IMPLANT
GOWN STRL REUS W/ TWL LRG LVL3 (GOWN DISPOSABLE) ×2 IMPLANT
GOWN STRL REUS W/TWL XL LVL4 (GOWN DISPOSABLE) ×1 IMPLANT
IV LR IRRIG 3000ML ARTHROMATIC (IV SOLUTION) ×4 IMPLANT
KIT STABILIZATION SHOULDER (MISCELLANEOUS) ×1 IMPLANT
KIT TURNOVER KIT A (KITS) ×1 IMPLANT
MANIFOLD NEPTUNE II (INSTRUMENTS) ×2 IMPLANT
MASK FACE SPIDER DISP (MASK) ×1 IMPLANT
MAT ABSORB FLUID 56X50 GRAY (MISCELLANEOUS) ×2 IMPLANT
NDL HYPO 22X1.5 SAFETY MO (MISCELLANEOUS) ×1 IMPLANT
NDL MAYO 6 CRC TAPER PT (NEEDLE) IMPLANT
NDL SCORPION MULTI FIRE (NEEDLE) IMPLANT
NEEDLE HYPO 22X1.5 SAFETY MO (MISCELLANEOUS) ×1 IMPLANT
NEEDLE MAYO 6 CRC TAPER PT (NEEDLE) IMPLANT
NEEDLE SCORPION MULTI FIRE (NEEDLE) IMPLANT
NS IRRIG 500ML POUR BTL (IV SOLUTION) IMPLANT
PACK ARTHROSCOPY SHOULDER (MISCELLANEOUS) ×1 IMPLANT
PAD ABD DERMACEA PRESS 5X9 (GAUZE/BANDAGES/DRESSINGS) ×1 IMPLANT
PAD ARMBOARD POSITIONER FOAM (MISCELLANEOUS) ×2 IMPLANT
PAD WRAPON POLAR SHDR XLG (MISCELLANEOUS) ×1 IMPLANT
PASSER SUT FIRSTPASS SELF (INSTRUMENTS) ×1 IMPLANT
SHAVER BLADE BONE CUTTER 5.5 (BLADE) IMPLANT
SLEEVE REMOTE CONTROL 5X12 (DRAPES) IMPLANT
SLING ARM M TX990204 (SOFTGOODS) IMPLANT
SLING ULTRA II M (MISCELLANEOUS) ×1 IMPLANT
SPONGE T-LAP 18X18 ~~LOC~~+RFID (SPONGE) ×1 IMPLANT
STRAP SAFETY 5IN WIDE (MISCELLANEOUS) ×1 IMPLANT
SUT ETHILON 3-0 (SUTURE) ×1 IMPLANT
SUT LASSO 90 DEG SD STR (SUTURE) IMPLANT
SUT MNCRL 4-0 27 PS-2 XMFL (SUTURE) IMPLANT
SUT PDS AB 0 CT1 27 (SUTURE) IMPLANT
SUT PROLENE 0 CT 2 (SUTURE) IMPLANT
SUT TICRON 2-0 30IN 311381 (SUTURE) IMPLANT
SUT VIC AB 0 CT1 36 (SUTURE) IMPLANT
SUT VIC AB 2-0 CT2 27 (SUTURE) IMPLANT
SUTURE MNCRL 4-0 27XMF (SUTURE) IMPLANT
SUTURE TAPE 1.3 40 TPR END (SUTURE) IMPLANT
SUTURETAPE 1.3 40 TPR END (SUTURE) IMPLANT
SYR 10ML LL (SYRINGE) IMPLANT
SYSTEM IMPL TENODESIS LNT 2.9 (Orthopedic Implant) IMPLANT
TAPE MICROFOAM 4IN (TAPE) ×1 IMPLANT
TRAP FLUID SMOKE EVACUATOR (MISCELLANEOUS) ×1 IMPLANT
TUBE SET DOUBLEFLO INFLOW (TUBING) ×1 IMPLANT
TUBE SET DOUBLEFLO OUTFLOW (TUBING) ×1 IMPLANT
WAND WEREWOLF FLOW 90D (MISCELLANEOUS) ×1 IMPLANT
WATER STERILE IRR 500ML POUR (IV SOLUTION) ×1 IMPLANT
WRAPON POLAR PAD SHDR XLG (MISCELLANEOUS) ×1 IMPLANT

## 2024-01-14 NOTE — Anesthesia Procedure Notes (Signed)
 Procedure Name: LMA Insertion Date/Time: 01/14/2024 12:33 PM  Performed by: Rich Brave, CRNAPre-anesthesia Checklist: Patient identified, Emergency Drugs available, Suction available, Patient being monitored and Timeout performed Patient Re-evaluated:Patient Re-evaluated prior to induction Oxygen Delivery Method: Circle system utilized Preoxygenation: Pre-oxygenation with 100% oxygen Induction Type: IV induction Ventilation: Mask ventilation without difficulty Laryngoscope Size: McGrath and 4 Grade View: Grade I Tube type: Oral Tube size: 7.0 mm Number of attempts: 1 Airway Equipment and Method: Stylet and Video-laryngoscopy Placement Confirmation: ETT inserted through vocal cords under direct vision, positive ETCO2 and breath sounds checked- equal and bilateral Secured at: 24 cm Tube secured with: Tape Dental Injury: Teeth and Oropharynx as per pre-operative assessment

## 2024-01-14 NOTE — Anesthesia Preprocedure Evaluation (Signed)
 Anesthesia Evaluation  Patient identified by MRN, date of birth, ID band Patient awake    Reviewed: Allergy & Precautions, NPO status , Patient's Chart, lab work & pertinent test results  Airway Mallampati: III  TM Distance: >3 FB Neck ROM: full    Dental  (+) Chipped   Pulmonary neg pulmonary ROS, former smoker   Pulmonary exam normal        Cardiovascular hypertension, On Medications negative cardio ROS Normal cardiovascular exam     Neuro/Psych  PSYCHIATRIC DISORDERS  Depression    negative neurological ROS     GI/Hepatic Neg liver ROS,,,  Endo/Other  negative endocrine ROS    Renal/GU      Musculoskeletal   Abdominal   Peds  Hematology negative hematology ROS (+)   Anesthesia Other Findings Past Medical History: No date: Alcohol consumption of more than four drinks per day No date: Colon polyps No date: Depression No date: High cholesterol No date: Hypertension No date: Supraspinatus tendonitis, right No date: Tremor of both hands  Past Surgical History: No date: APPENDECTOMY No date: COLONOSCOPY No date: FOOT SURGERY; Right No date: HAND SURGERY; Right No date: HERNIA REPAIR No date: TONSILLECTOMY No date: VASECTOMY  BMI    Body Mass Index: 28.69 kg/m      Reproductive/Obstetrics negative OB ROS                             Anesthesia Physical Anesthesia Plan  ASA: 2  Anesthesia Plan: General ETT   Post-op Pain Management: Regional block*   Induction: Intravenous  PONV Risk Score and Plan: 2 and Ondansetron, Dexamethasone and Midazolam  Airway Management Planned: Oral ETT  Additional Equipment:   Intra-op Plan:   Post-operative Plan: Extubation in OR  Informed Consent: I have reviewed the patients History and Physical, chart, labs and discussed the procedure including the risks, benefits and alternatives for the proposed anesthesia with the patient or  authorized representative who has indicated his/her understanding and acceptance.     Dental Advisory Given  Plan Discussed with: Anesthesiologist, CRNA and Surgeon  Anesthesia Plan Comments: (Patient consented for risks of anesthesia including but not limited to:  - adverse reactions to medications - damage to eyes, teeth, lips or other oral mucosa - nerve damage due to positioning  - sore throat or hoarseness - Damage to heart, brain, nerves, lungs, other parts of body or loss of life  Patient voiced understanding and assent.)       Anesthesia Quick Evaluation

## 2024-01-14 NOTE — Anesthesia Postprocedure Evaluation (Signed)
 Anesthesia Post Note  Patient: Luke Dudley  Procedure(s) Performed: ARTHROSCOPY, SHOULDER WITH REPAIR, ROTATOR CUFF, OPEN (Right: Shoulder)  Patient location during evaluation: PACU Anesthesia Type: General Level of consciousness: awake and alert Pain management: pain level controlled Vital Signs Assessment: post-procedure vital signs reviewed and stable Respiratory status: spontaneous breathing, nonlabored ventilation, respiratory function stable and patient connected to nasal cannula oxygen Cardiovascular status: blood pressure returned to baseline and stable Postop Assessment: no apparent nausea or vomiting Anesthetic complications: no  No notable events documented.   Last Vitals:  Vitals:   01/14/24 1445 01/14/24 1510  BP: (!) 150/74 (!) 160/81  Pulse: 84 86  Resp: 17 18  Temp:  (!) 36.3 C  SpO2: 90% 93%    Last Pain:  Vitals:   01/14/24 1510  TempSrc: Temporal  PainSc: 0-No pain                 Stephanie Coup

## 2024-01-14 NOTE — Discharge Instructions (Addendum)
 Post-Op Instructions - Rotator Cuff Repair  1. Bracing: You will wear a shoulder immobilizer or sling for 4 weeks.   2. Driving: No driving for 4 weeks post-op.  3. Activity: No active lifting for 2 months. Wrist, hand, and elbow motion only. Avoid lifting the upper arm away from the body except for hygiene. You are permitted to bend and straighten the elbow passively only (no active elbow motion). You may use your hand and wrist for typing, writing, and managing utensils (cutting food). Do not lift more than a coffee cup for 8 weeks.  When sleeping or resting, inclined positions (recliner chair or wedge pillow) and a pillow under the forearm for support may provide better comfort for up to 4 weeks.  Avoid long distance travel for 4 weeks.  Return to normal activities after rotator cuff repair repair normally takes 6 months on average. If rehab goes very well, may be able to do most activities at 4 months, except overhead or contact sports.  4. Physical Therapy: Begins 3-4 days after surgery, and proceed 1 time per week for the first 6 weeks, then 1-2 times per week from weeks 6-20 post-op.  5. Medications:  - You will be provided a prescription for narcotic pain medicine. After surgery, take 1-2 narcotic tablets every 4 hours if needed for severe pain.  - A prescription for anti-nausea medication will be provided in case the narcotic medicine causes nausea - take 1 tablet every 6 hours only if nauseated.   - Take tylenol 1000 mg (2 Extra Strength tablets or 3 regular strength) every 8 hours for pain.  May decrease or stop tylenol 5 days after surgery if you are having minimal pain. - Take ASA 325mg /day x 2 weeks to help prevent DVTs/PEs (blood clots).  - DO NOT take ANY nonsteroidal anti-inflammatory pain medications (Advil, Motrin, Ibuprofen, Aleve, Naproxen, or Naprosyn). These medicines can inhibit healing of your shoulder repair.    If you are taking prescription medication for anxiety,  depression, insomnia, muscle spasm, chronic pain, or for attention deficit disorder, you are advised that you are at a higher risk of adverse effects with use of narcotics post-op, including narcotic addiction/dependence, depressed breathing, death. If you use non-prescribed substances: alcohol, marijuana, cocaine, heroin, methamphetamines, etc., you are at a higher risk of adverse effects with use of narcotics post-op, including narcotic addiction/dependence, depressed breathing, death. You are advised that taking > 50 morphine milligram equivalents (MME) of narcotic pain medication per day results in twice the risk of overdose or death. For your prescription provided: oxycodone 5 mg - taking more than 6 tablets per day would result in > 50 morphine milligram equivalents (MME) of narcotic pain medication. Be advised that we will prescribe narcotics short-term, for acute post-operative pain only - 3 weeks for major operations such as shoulder repair/reconstruction surgeries.     6. Post-Op Appointment:  Your first post-op appointment will be 10-14 days post-op.  7. Work or School: For most, but not all procedures, we advise staying out of work or school for at least 1 to 2 weeks in order to recover from the stress of surgery and to allow time for healing.   If you need a work or school note this can be provided.   8. Smoking: If you are a smoker, you need to refrain from smoking in the postoperative period. The nicotine in cigarettes will inhibit healing of your shoulder repair and decrease the chance of successful repair. Similarly, nicotine containing products (  gum, patches) should be avoided.   Post-operative Brace: Apply and remove the brace you received as you were instructed to at the time of fitting and as described in detail as the brace's instructions for use indicate.  Wear the brace for the period of time prescribed by your physician.  The brace can be cleaned with soap and water and  allowed to air dry only.  Should the brace result in increased pain, decreased feeling (numbness/tingling), increased swelling or an overall worsening of your medical condition, please contact your doctor immediately.  If an emergency situation occurs as a result of wearing the brace after normal business hours, please dial 911 and seek immediate medical attention.  Let your doctor know if you have any further questions about the brace issued to you. Refer to the shoulder sling instructions for use if you have any questions regarding the correct fit of your shoulder sling.  [facility] Customer Care for Troubleshooting: [phone]  Video that illustrates how to properly use a shoulder sling: "Instructions for Proper Use of an Orthopaedic Sling" http://bass.com/   POLAR CARE INFORMATION  MassAdvertisement.it  How to use [employer] Polar Care [facility] Therapy System?  YouTube   ShippingScam.co.uk  OPERATING INSTRUCTIONS  Start the product With dry hands, connect the transformer to the electrical connection located on the top of the cooler. Next, plug the transformer into an appropriate electrical outlet. The unit will automatically start running at this point.  To stop the pump, disconnect electrical power.  Unplug to stop the product when not in use. Unplugging the Polar Care unit turns it off. Always unplug immediately after use. Never leave it plugged in while unattended. Remove pad.    FIRST ADD WATER TO FILL LINE, THEN ICE---Replace ice when existing ice is almost melted  1 Discuss Treatment with your Licensed Health Care Practitioner and Use Only as Prescribed 2 Apply Insulation Barrier & Cold Therapy Pad 3 Check for Moisture 4 Inspect Skin Regularly  Tips and Trouble Shooting Usage Tips 1. Use cubed or chunked ice for optimal performance. 2. It is recommended to drain the Pad between uses. To drain the pad, hold the Pad upright with the hose  pointed toward the ground. Depress the black plunger and allow water to drain out. 3. You may disconnect the Pad from the unit without removing the pad from the affected area by depressing the silver tabs on the hose coupling and gently pulling the hoses apart. The Pad and unit will seal itself and will not leak. Note: Some dripping during release is normal. 4. DO NOT RUN PUMP WITHOUT WATER! The pump in this unit is designed to run with water. Running the unit without water will cause permanent damage to the pump. 5. Unplug unit before removing lid.  TROUBLESHOOTING GUIDE Pump not running, Water not flowing to the pad, Pad is not getting cold 1. Make sure the transformer is plugged into the wall outlet. 2. Confirm that the ice and water are filled to the indicated levels. 3. Make sure there are no kinks in the pad. 4. Gently pull on the blue tube to make sure the tube/pad junction is straight. 5. Remove the pad from the treatment site and ll it while the pad is lying at; then reapply. 6. Confirm that the pad couplings are securely attached to the unit. Listen for the double clicks (Figure 1) to confirm the pad couplings are securely attached.  Leaks    Note: Some condensation on the lines, controller,  and pads is unavoidable, especially in warmer climates. 1. If using a [employer] Polar Care Cold Therapy unit with a detachable Cold Therapy Pad, and a leak exists (other than condensation on the lines) disconnect the pad couplings. Make sure the silver tabs on the couplings are depressed before reconnecting the pad to the pump hose; then confirm both sides of the coupling are properly clicked in. 2. If the coupling continues to leak or a leak is detected in the pad itself, stop using it and call [employer] Customer Care at [phone].  Cleaning After use, empty and dry the unit with a soft cloth. Warm water and mild detergent may be used occasionally to clean the pump and tubes.  WARNING: The Polar Care  Cube can be cold enough to cause serious injury, including full skin necrosis. Follow these Operating Instructions, and carefully read the Product Insert (see pouch on side of unit) and the Cold Therapy Pad Fitting Instructions (provided with each Cold Therapy Pad) prior to use.       Surgery is complete. Please report to the surgery info. desk to speak with the surgeon.  If you are not in the hospital, the surgeon will call you.

## 2024-01-14 NOTE — Transfer of Care (Signed)
 Immediate Anesthesia Transfer of Care Note  Patient: Luke Dudley  Procedure(s) Performed: ARTHROSCOPY, SHOULDER WITH REPAIR, ROTATOR CUFF, OPEN (Right: Shoulder)  Patient Location: PACU  Anesthesia Type:General  Level of Consciousness: awake, alert , oriented, and patient cooperative  Airway & Oxygen Therapy: Patient Spontanous Breathing and Patient connected to face mask oxygen  Post-op Assessment: Report given to RN and Post -op Vital signs reviewed and stable  Post vital signs: Reviewed and stable  Last Vitals:  Vitals Value Taken Time  BP 152/77 01/14/24 1430  Temp 36.3 C 01/14/24 1426  Pulse 80 01/14/24 1432  Resp 17 01/14/24 1432  SpO2 96 % 01/14/24 1432  Vitals shown include unfiled device data.  Last Pain:  Vitals:   01/14/24 1023  TempSrc:   PainSc: 0-No pain      Patients Stated Pain Goal: 0 (01/14/24 1019)  Complications: No notable events documented.

## 2024-01-14 NOTE — H&P (Signed)
 Paper H&P to be scanned into permanent record. H&P reviewed. No significant changes noted.

## 2024-01-14 NOTE — Op Note (Signed)
 SURGERY DATE: 01/14/2024   PRE-OP DIAGNOSIS:  1. Right subacromial impingement 2. Right biceps tendinopathy 3. Right rotator cuff tear 4. Right acromioclavicular joint arthritis  POST-OP DIAGNOSIS: 1. Right subacromial impingement 2. Right biceps tendinopathy 3. Right rotator cuff tear 4. Right acromioclavicular joint arthritis  PROCEDURES:  1. Right arthroscopic rotator cuff repair (high-grade articular sided anterior supraspinatus) 2. Right arthroscopic biceps tenodesis 3. Right arthroscopic subacromial decompression 4. Right arthroscopic extensive debridement of shoulder (glenohumeral and subacromial spaces) 5. Right arthroscopic distal clavicle excision   SURGEON: Rosealee Albee, MD   ASSISTANT: Sonny Dandy, PA   ANESTHESIA: Gen with Exparel interscalene block   ESTIMATED BLOOD LOSS: 5cc   DRAINS:  none   TOTAL IV FLUIDS: per anesthesia      SPECIMENS: none   IMPLANTS:  - Arthrex 2.76mm PushLock x 1 - Arthrex 4.23mm SwiveLock x 1 - Iconix SPEED double loaded with 1.2 and 2.54mm tape x 1     OPERATIVE FINDINGS:  Examination under anesthesia: A careful examination under anesthesia was performed.  Passive range of motion was: FF: 150; ER at side: 45; ER in abduction: 90; IR in abduction: 45.  Anterior load shift: NT.  Posterior load shift: NT.  Sulcus in neutral: NT.  Sulcus in ER: NT.     Intra-operative findings: A thorough arthroscopic examination of the shoulder was performed.  The findings are: 1. Biceps tendon: tendinopathy with significant erythema  2. Superior labrum: Type II SLAP tear with significant erythema 3. Posterior labrum and capsule: normal 4. Inferior capsule and inferior recess: normal 5. Glenoid cartilage surface: Normal 6. Supraspinatus attachment: High-grade partial-thickness articular sided tear of the anterior supraspinatus.  This involved approximately 75% of the footprint. 7. Posterior rotator cuff attachment: normal 8. Humeral head  articular cartilage: normal 9. Rotator interval: significant synovitis 10: Subscapularis tendon: attachment intact 11. Anterior labrum: Mildly degenerative and erythematous 12. IGHL: normal   OPERATIVE REPORT:    Indications for procedure:  Luke Dudley is a 68 y.o. male with over 6 months of right shoulder pain.  He has had difficulty with overhead motion since that time with sensations of weakness. Clinical exam and MRI were suggestive of high-grade partial-thickness rotator cuff tear, biceps tendinopathy, acromioclavicular joint arthritis and subacromial impingement. After discussion of risks, benefits, and alternatives to surgery, the patient elected to proceed.    Procedure in detail:   I identified Luke Dudley in the pre-operative holding area.  I marked the operative shoulder with my initials. I reviewed the risks and benefits of the proposed surgical intervention, and the patient wished to proceed.  Anesthesia was then performed with an Exparel interscalene block.  The patient was transferred to the operative suite and placed in the beach chair position.     Appropriate IV antibiotics were administered prior to incision. The operative upper extremity was then prepped and draped in standard fashion. A time out was performed confirming the correct extremity, correct patient, and correct procedure.    I then created a standard posterior portal with an 11 blade. The glenohumeral joint was easily entered with a blunt trocar and the arthroscope introduced. The findings of diagnostic arthroscopy are described above. I debrided degenerative tissue including the synovitic tissue about the rotator interval and anterior and superior labrum. I then coagulated the inflamed synovium to obtain hemostasis and reduce the risk of post-operative swelling using an Arthrocare radiofrequency device.   I then turned my attention to the arthroscopic biceps tenodesis. The Loop  n Tack technique was used to  pass a FiberTape through the biceps in a locked fashion adjacent to the biceps anchor.  A hole for a 2.9 mm Arthrex PushLock was drilled in the bicipital groove just superior to the subscapularis tendon insertion.  The biceps tendon was then cut and the biceps anchor complex was debrided down to a stable base on the superior labrum.  The FiberTape was loaded onto the PushLock anchor and impacted into place into the previously drilled hole in the bicipital groove.  This appropriately secured the biceps into the bicipital groove and took it off of tension.   Next, a 2-0 PDS suture was passed through the articular sided supraspinatus tear, later to be used as a marking stitch on the bursal side.  The arthroscope was then introduced into the subacromial space. A direct lateral portal was created with an 11-blade after spinal needle localization. An extensive subacromial bursectomy and debridement was performed using a combination of the shaver and Arthrocare wand. The entire acromial undersurface was exposed and the CA ligament was subperiosteally elevated to expose the anterior acromial hook. A burr was used to create a flat anterior and lateral aspect of the acromion, converting it from a Type 2 to a Type 1 acromion. Care was made to keep the deltoid fascia intact.  I then turned my attention to the arthroscopic distal clavicle excision. I identified the acromioclavicular joint. Surrounding bursal tissue was debrided and the edges of the joint were identified. I used the 5.50mm barrel burr to remove the distal clavicle parallel to the edge of the acromion. I was able to fit two widths of the burr into the space between the distal clavicle and acromion, signifying that I had removed ~76mm of distal clavicle. This was confirmed by viewing anteriorly and introducing a probe with measuring marks from the lateral portal. Hemostasis was achieved with an Arthrocare wand.    Next, I created an accessory posterolateral  portal to assist with visualization of the rotator cuff tear and instrumentation of the repair.  The previously passed PDS suture was identified.  I was easily able to push a switching stick through the bursal remnant of the partial-thickness tear, thus converted to a full-thickness tear.  I debrided the poor quality edges of the supraspinatus tendon.  This ended up being a U-shaped tear of the supraspinatus.  I prepared the footprint using a burr to expose bleeding bone.    I then percutaneously placed 1 Iconix SPEED medial row anchor at the articular margin. I then shuttled all 4 strands of tape through the rotator cuff just lateral to the musculotendinous junction using a FirstPass suture passer spanning the anterior to posterior extent of the tear. All 4 strands of suture were passed through an [employer] anchor.  This was placed approximately 2 cm distal to the lateral edge of the footprint in line with the midportion of the tear with appropriate tensioning of each suture prior to final fixation. This construct allowed for excellent reapproximation of the rotator cuff to its native footprint without undue tension.  Appropriate compression was achieved. The repair was stable to external and internal rotation.   Fluid was evacuated from the shoulder, and the portals were closed with 3-0 Nylon. Xeroform was applied to the portals. A sterile dressing was applied, followed by a Polar Care sleeve and a SlingShot shoulder immobilizer/sling. The patient was awakened from anesthesia without difficulty and was transferred to the PACU in stable condition.  Of note, assistance from a PA was essential to performing the surgery.  PA was present for the entire surgery.  PA assisted with patient positioning, retraction, instrumentation, and wound closure. The surgery would have been more difficult and had longer operative time without PA assistance.   COMPLICATIONS: none   DISPOSITION: plan for discharge  home after recovery in PACU     POSTOPERATIVE PLAN: Remain in sling (except hygiene and elbow/wrist/hand RoM exercises as instructed by PT) x 4 weeks and NWB for this time. PT to begin 3-4 days after surgery.  Small/medium rotator cuff repair rehab protocol. ASA 325mg  daily x 2 weeks for DVT ppx.

## 2024-01-14 NOTE — Anesthesia Procedure Notes (Signed)
 Anesthesia Regional Block: Interscalene brachial plexus block   Pre-Anesthetic Checklist: , timeout performed,  Correct Patient, Correct Site, Correct Laterality,  Correct Procedure, Correct Position, site marked,  Risks and benefits discussed,  Surgical consent,  Pre-op evaluation,  At surgeon's request and post-op pain management  Laterality: Right  Prep: chloraprep       Needles:  Injection technique: Single-shot  Needle Type: Echogenic Needle     Needle Length: 4cm  Needle Gauge: 25     Additional Needles:   Procedures:,,,, ultrasound used (permanent image in chart),,    Narrative:  Start time: 01/14/2024 11:15 AM End time: 01/14/2024 11:16 AM Injection made incrementally with aspirations every 5 mL.  Performed by: Personally  Anesthesiologist: Stephanie Coup, MD  Additional Notes: Patient's chart reviewed and they were deemed appropriate candidate for procedure, at surgeon's request. Patient educated about risks, benefits, and alternatives of the block including but not limited to: temporary or permanent nerve damage, bleeding, infection, damage to surround tissues, pneumothorax, hemidiaphragmatic paralysis, unilateral Horner's syndrome, block failure, local anesthetic toxicity. Patient expressed understanding. A formal time-out was conducted consistent with institution rules.  Monitors were applied, and minimal sedation used (see nursing record). The site was prepped with skin prep and allowed to dry, and sterile gloves were used. A high frequency linear ultrasound probe with probe cover was utilized throughout. C5-7 nerve roots located and appeared anatomically normal, local anesthetic injected around them, and echogenic block needle trajectory was monitored throughout. Aspiration performed every 5ml. Lung and blood vessels were avoided. All injections were performed without resistance and free of blood and paresthesias. The patient tolerated the procedure well.  Injectate:  20ml exparel + 10ml 0.5% bupivacaine

## 2024-01-17 ENCOUNTER — Encounter: Payer: Self-pay | Admitting: Orthopedic Surgery

## 2024-05-12 ENCOUNTER — Ambulatory Visit

## 2024-05-12 DIAGNOSIS — K621 Rectal polyp: Secondary | ICD-10-CM | POA: Diagnosis not present

## 2024-05-12 DIAGNOSIS — Z09 Encounter for follow-up examination after completed treatment for conditions other than malignant neoplasm: Secondary | ICD-10-CM | POA: Diagnosis present

## 2024-05-12 DIAGNOSIS — K640 First degree hemorrhoids: Secondary | ICD-10-CM | POA: Diagnosis not present

## 2024-05-12 DIAGNOSIS — Z860100 Personal history of colon polyps, unspecified: Secondary | ICD-10-CM | POA: Diagnosis not present

## 2024-07-13 ENCOUNTER — Other Ambulatory Visit: Payer: Self-pay | Admitting: Neurology

## 2024-07-13 ENCOUNTER — Encounter: Payer: Self-pay | Admitting: Neurology

## 2024-07-13 DIAGNOSIS — R42 Dizziness and giddiness: Secondary | ICD-10-CM

## 2024-07-13 DIAGNOSIS — R430 Anosmia: Secondary | ICD-10-CM

## 2024-07-13 DIAGNOSIS — G2571 Drug induced akathisia: Secondary | ICD-10-CM

## 2024-07-13 DIAGNOSIS — R432 Parageusia: Secondary | ICD-10-CM

## 2024-07-13 DIAGNOSIS — I639 Cerebral infarction, unspecified: Secondary | ICD-10-CM

## 2024-07-13 DIAGNOSIS — R499 Unspecified voice and resonance disorder: Secondary | ICD-10-CM

## 2024-07-15 ENCOUNTER — Ambulatory Visit
Admission: RE | Admit: 2024-07-15 | Discharge: 2024-07-15 | Disposition: A | Discharge status: Home / Self Care | Source: Ambulatory Visit | Attending: Neurology | Admitting: Neurology

## 2024-07-15 DIAGNOSIS — R499 Unspecified voice and resonance disorder: Secondary | ICD-10-CM

## 2024-07-15 DIAGNOSIS — G2571 Drug induced akathisia: Secondary | ICD-10-CM

## 2024-07-15 DIAGNOSIS — R430 Anosmia: Secondary | ICD-10-CM

## 2024-07-15 DIAGNOSIS — I639 Cerebral infarction, unspecified: Secondary | ICD-10-CM

## 2024-07-15 DIAGNOSIS — R432 Parageusia: Secondary | ICD-10-CM

## 2024-07-15 DIAGNOSIS — R42 Dizziness and giddiness: Secondary | ICD-10-CM
